# Patient Record
Sex: Male | Born: 1995 | Race: Black or African American | Hispanic: No | Marital: Single
Health system: Southern US, Community
[De-identification: ages and names within clinical notes are randomized; demographics above are authoritative.]

---
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
## 2017-10-18 ENCOUNTER — Ambulatory Visit
Admission: RE | Admit: 2017-10-18 | Discharge: 2017-10-18 | Disposition: A | Discharge status: Home / Self Care | Payer: No Typology Code available for payment source | Source: Ambulatory Visit | Attending: Physical Medicine and Rehabilitation | Admitting: Physical Medicine and Rehabilitation

## 2017-10-18 ENCOUNTER — Other Ambulatory Visit: Payer: Self-pay | Admitting: Physical Medicine and Rehabilitation

## 2017-10-18 DIAGNOSIS — Z021 Encounter for pre-employment examination: Secondary | ICD-10-CM

## 2018-05-08 ENCOUNTER — Ambulatory Visit: Payer: Self-pay

## 2018-05-08 ENCOUNTER — Other Ambulatory Visit: Payer: Self-pay | Admitting: Family Medicine

## 2018-05-08 DIAGNOSIS — M79671 Pain in right foot: Secondary | ICD-10-CM

## 2019-03-19 IMAGING — DX DG FOOT COMPLETE 3+V*R*
3 series · 3 of 3 positions shown · non-contrast
Comparison: None.

CLINICAL DATA: Right foot pain post trauma.

EXAM:
RIGHT FOOT COMPLETE - 3+ VIEW

[foot ap]
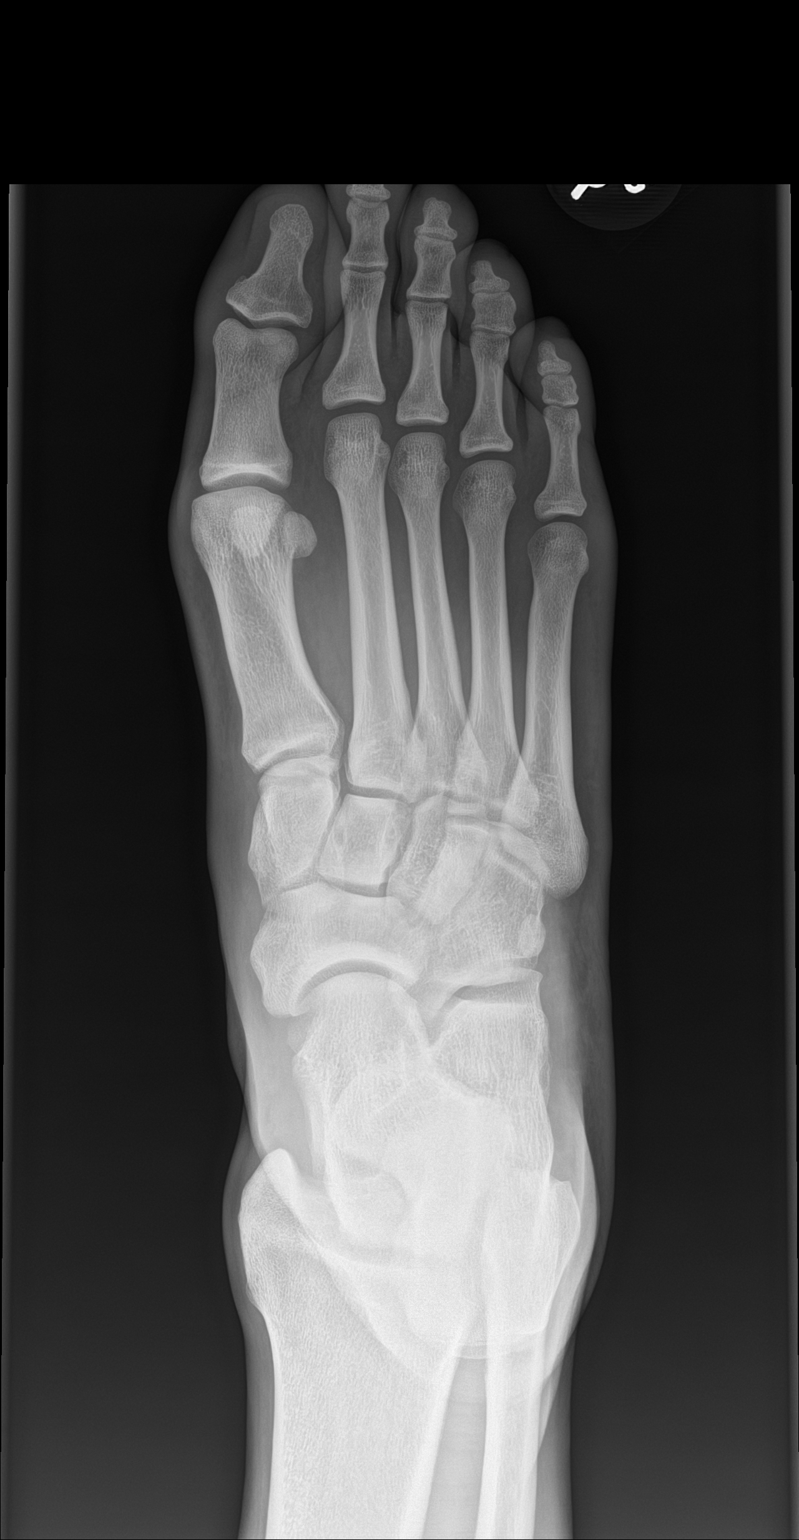

[foot obl]
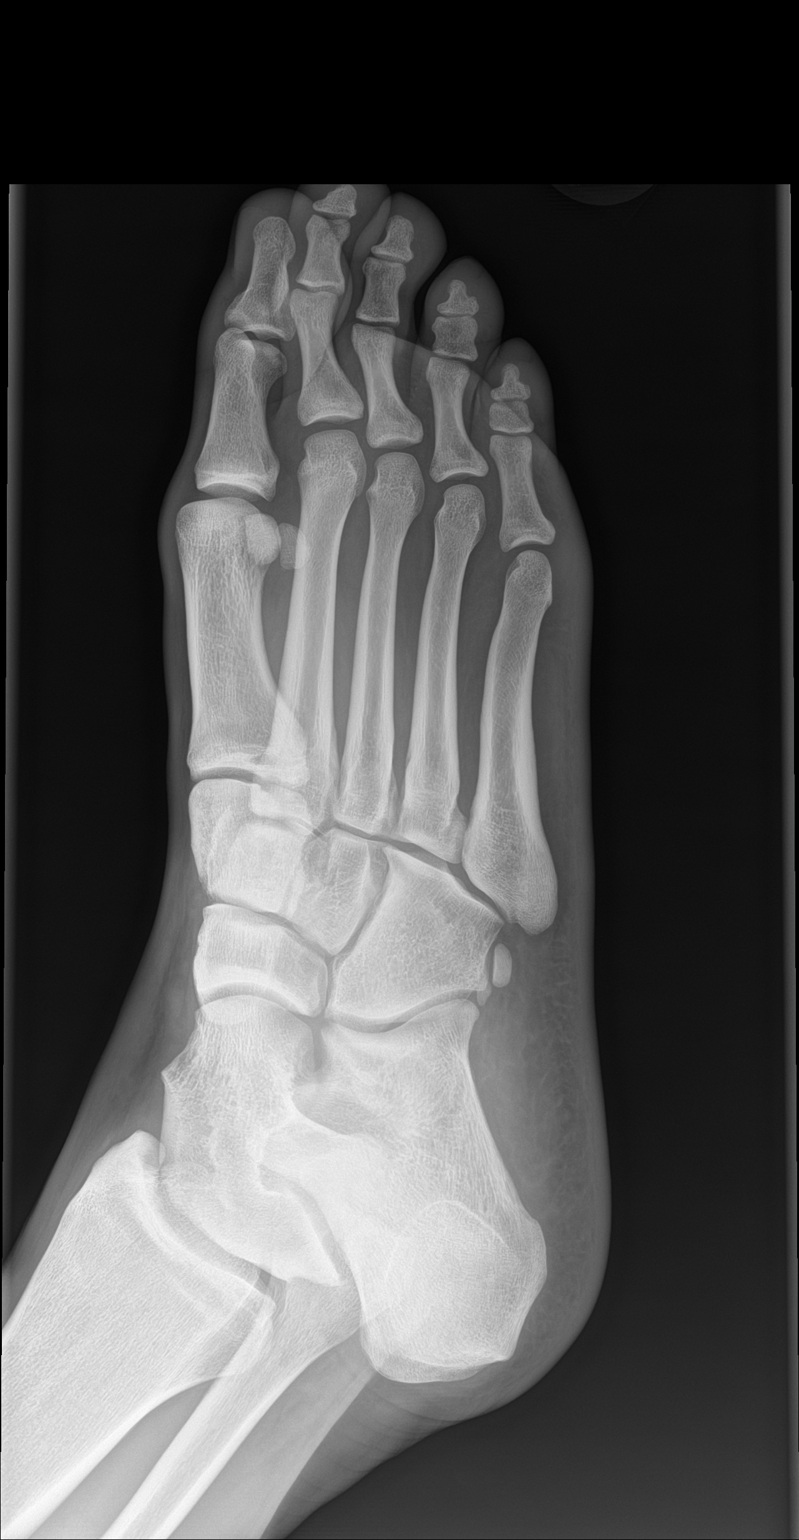

[foot lat]
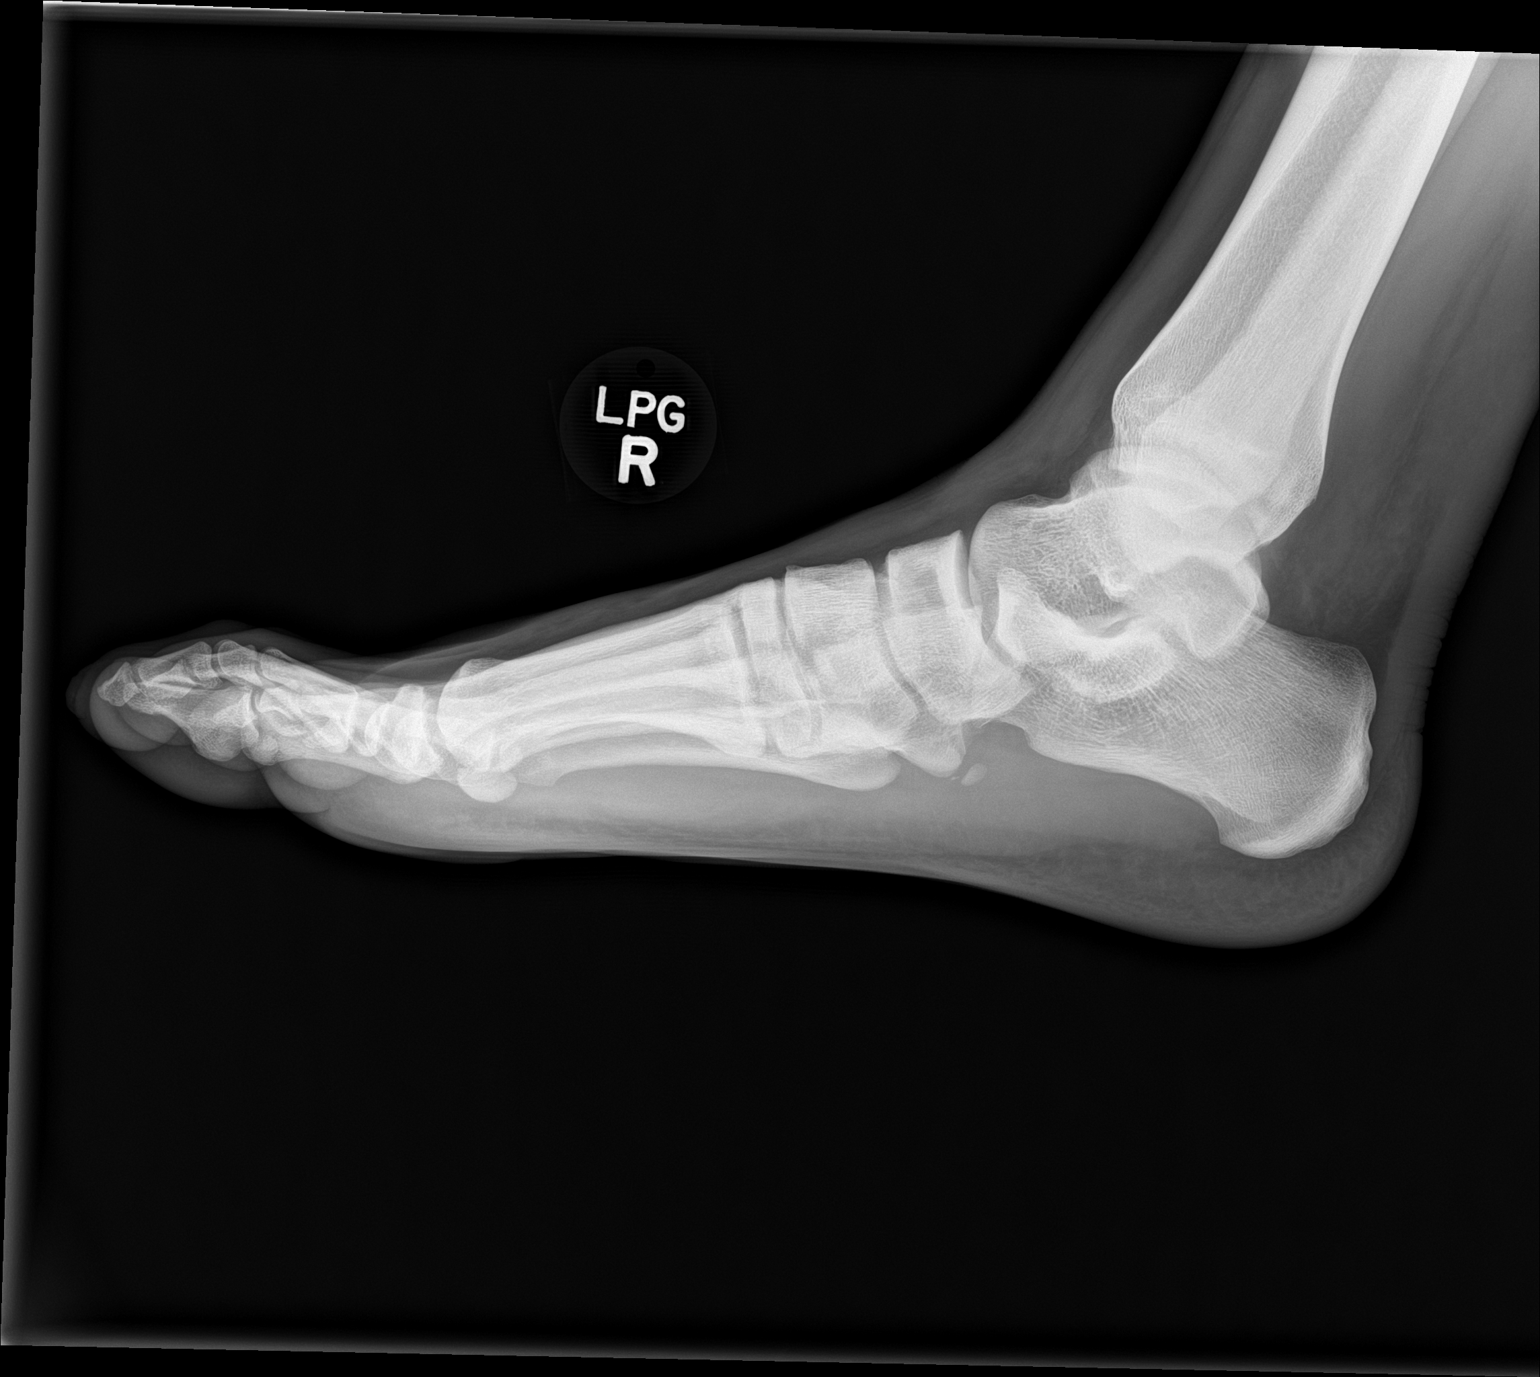

[3 of 3 positions shown; findings below may reference images not displayed]

FINDINGS: There is no evidence of fracture or dislocation. There is no
evidence of arthropathy or other focal bone abnormality. Minimal
dorsal soft tissue swelling.
IMPRESSION: No acute fracture or dislocation identified about the right foot.
